# Patient Record
Sex: Male | Born: 1990 | Race: Black or African American | Hispanic: No | Marital: Single | Smoking: Never smoker
Health system: Southern US, Community
[De-identification: ages and names within clinical notes are randomized; demographics above are authoritative.]

## PROBLEM LIST (undated history)

## (undated) DIAGNOSIS — N492 Inflammatory disorders of scrotum: Secondary | ICD-10-CM

---

## 2014-12-20 ENCOUNTER — Other Ambulatory Visit (HOSPITAL_COMMUNITY)
Admission: RE | Admit: 2014-12-20 | Discharge: 2014-12-20 | Disposition: A | Discharge status: Home / Self Care | Payer: BLUE CROSS/BLUE SHIELD | Source: Ambulatory Visit | Attending: Family Medicine | Admitting: Family Medicine

## 2014-12-20 ENCOUNTER — Encounter (HOSPITAL_COMMUNITY): Payer: Self-pay | Admitting: Emergency Medicine

## 2014-12-20 ENCOUNTER — Emergency Department (INDEPENDENT_AMBULATORY_CARE_PROVIDER_SITE_OTHER)
Admission: EM | Admit: 2014-12-20 | Discharge: 2014-12-20 | Disposition: A | Discharge status: Home / Self Care | Payer: BLUE CROSS/BLUE SHIELD | Source: Home / Self Care

## 2014-12-20 DIAGNOSIS — Z113 Encounter for screening for infections with a predominantly sexual mode of transmission: Secondary | ICD-10-CM | POA: Diagnosis present

## 2014-12-20 DIAGNOSIS — Z202 Contact with and (suspected) exposure to infections with a predominantly sexual mode of transmission: Secondary | ICD-10-CM | POA: Diagnosis not present

## 2014-12-20 MED ORDER — AZITHROMYCIN 250 MG PO TABS
1000.0000 mg | ORAL_TABLET | Freq: Once | ORAL | Status: AC
  Administered 2014-12-20: 1000 mg via ORAL

## 2014-12-20 MED ORDER — AZITHROMYCIN 250 MG PO TABS
ORAL_TABLET | ORAL | Status: AC
  Filled 2014-12-20: qty 4
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
## 2014-12-20 NOTE — ED Notes (Signed)
Here to be screened for STD; he is asymptomatic Partner is being treated for chlamydia Alert, no signs of acute distress.

## 2014-12-20 NOTE — ED Notes (Signed)
Call back number verified.  

## 2014-12-20 NOTE — Discharge Instructions (Signed)
You are exposed to a STD. Your treated for this in our clinic with an antibiotic. Please go and eat some food as this medicine may make a little nauseous. We will call you if any of your other results show further infection. Remember to practice safe sex.

## 2014-12-21 LAB — URINE CYTOLOGY ANCILLARY ONLY
CHLAMYDIA, DNA PROBE: NEGATIVE
Neisseria Gonorrhea: NEGATIVE
Trichomonas: NEGATIVE

## 2016-03-21 ENCOUNTER — Encounter (HOSPITAL_COMMUNITY): Payer: Self-pay | Admitting: Emergency Medicine

## 2016-03-21 ENCOUNTER — Emergency Department (HOSPITAL_COMMUNITY): Payer: Self-pay

## 2016-03-21 ENCOUNTER — Emergency Department (HOSPITAL_COMMUNITY)
Admission: EM | Admit: 2016-03-21 | Discharge: 2016-03-21 | Disposition: A | Discharge status: Home / Self Care | Payer: Self-pay | Attending: Emergency Medicine | Admitting: Emergency Medicine

## 2016-03-21 DIAGNOSIS — Y999 Unspecified external cause status: Secondary | ICD-10-CM | POA: Insufficient documentation

## 2016-03-21 DIAGNOSIS — Y929 Unspecified place or not applicable: Secondary | ICD-10-CM | POA: Insufficient documentation

## 2016-03-21 DIAGNOSIS — Y9367 Activity, basketball: Secondary | ICD-10-CM | POA: Insufficient documentation

## 2016-03-21 DIAGNOSIS — M25571 Pain in right ankle and joints of right foot: Secondary | ICD-10-CM

## 2016-03-21 DIAGNOSIS — W1789XA Other fall from one level to another, initial encounter: Secondary | ICD-10-CM | POA: Insufficient documentation

## 2016-03-21 DIAGNOSIS — S92151A Displaced avulsion fracture (chip fracture) of right talus, initial encounter for closed fracture: Secondary | ICD-10-CM | POA: Insufficient documentation

## 2016-03-21 MED ORDER — NAPROXEN 250 MG PO TABS
500.0000 mg | ORAL_TABLET | Freq: Once | ORAL | Status: AC
  Administered 2016-03-21: 500 mg via ORAL
  Filled 2016-03-21: qty 2

## 2016-03-21 MED ORDER — NAPROXEN 500 MG PO TABS: 500.0000 mg | ORAL_TABLET | Freq: Two times a day (BID) | ORAL | 0 refills | Status: DC

## 2016-03-21 MED ORDER — HYDROCODONE-ACETAMINOPHEN 5-325 MG PO TABS
1.0000 | ORAL_TABLET | Freq: Once | ORAL | Status: AC
  Administered 2016-03-21: 1 via ORAL
  Filled 2016-03-21: qty 1

## 2016-03-21 MED ORDER — HYDROCODONE-ACETAMINOPHEN 5-325 MG PO TABS: 1.0000 | ORAL_TABLET | Freq: Four times a day (QID) | ORAL | 0 refills | Status: DC | PRN

## 2016-03-21 NOTE — ED Provider Notes (Signed)
[facility]EMERGENCY DEPT Provider Note   CSN: [id number] Arrival date & time: 03/21/16  1022  First Provider Contact:  First MD Initiated Contact with Patient 03/21/16 1048      By signing my name below, I, Tracy Cisneros, attest that this documentation has been prepared under the direction and in the presence of non-physician practitioner, Cheri Fowler, PA-C. Electronically Signed: Freida Cisneros, Scribe. 03/21/2016. 11:38 AM.   History   Chief Complaint Chief Complaint  Patient presents with  . Ankle Pain    The history is provided by the patient. No language interpreter was used.    HPI Comments:  Tracy Cisneros is a 25 y.o. male who presents to the Emergency Department complaining of constant, moderate, right ankle pain following injury last night. Pt states he landed improperly on the right foot after a jump. He denies numbness in the extremity. Pt has taken tylenol without relief. Pt has no other complaints or symptoms at this time.    History reviewed. No pertinent past medical history.  There are no active problems to display for this patient.   History reviewed. No pertinent surgical history.     Home Medications    Prior to Admission medications   Medication Sig Start Date End Date Taking? Authorizing Provider  HYDROcodone-acetaminophen (NORCO/VICODIN) 5-325 MG tablet Take 1-2 tablets by mouth every 6 (six) hours as needed. 03/21/16   Cheri Fowler, PA-C  naproxen (NAPROSYN) 500 MG tablet Take 1 tablet (500 mg total) by mouth 2 (two) times daily. 03/21/16   Cheri Fowler, PA-C    Family History Family History  Problem Relation Age of Onset  . Cancer Neg Hx   . Diabetes Neg Hx   . Heart failure Neg Hx   . Hyperlipidemia Neg Hx     Social History Social History  Substance Use Topics  . Smoking status: Never Smoker  . Smokeless tobacco: Never Used  . Alcohol use Yes     Allergies   Review of patient's allergies indicates no known allergies.   Review of  Systems Review of Systems  Musculoskeletal: Positive for arthralgias and myalgias.  Neurological: Negative for weakness and numbness.     Physical Exam Updated Vital Signs BP 126/96 (BP Location: Right Arm)   Pulse 78   Temp 97.6 F (36.4 C) (Oral)   Resp 18   Wt 113.4 kg   SpO2 99%   Physical Exam  Constitutional: He is oriented to person, place, and time. He appears well-developed and well-nourished.  HENT:  Head: Normocephalic and atraumatic.  Right Ear: External ear normal.  Left Ear: External ear normal.  Eyes: Conjunctivae are normal. No scleral icterus.  Neck: No tracheal deviation present.  Cardiovascular:  Pulses:      Dorsalis pedis pulses are 2+ on the right side, and 2+ on the left side.  Brisk cap refill.   Pulmonary/Chest: Effort normal. No respiratory distress.  Abdominal: He exhibits no distension.  Musculoskeletal: Normal range of motion. He exhibits edema and tenderness. He exhibits no deformity.  Right ankle TTP over lateral joint with mild swelling.  No erythema or ecchymosis.  FAROM with pain.  Neurological: He is alert and oriented to person, place, and time.  Strength and sensation intact.   Skin: Skin is warm and dry.  Psychiatric: He has a normal mood and affect. His behavior is normal.     ED Treatments / Results  DIAGNOSTIC STUDIES:  Oxygen Saturation is 99% on RA, normal by my interpretation.  COORDINATION OF CARE:  11:35 AM Discussed treatment plan with pt at bedside and pt agreed to plan.  Labs (all labs ordered are listed, but only abnormal results are displayed) Labs Reviewed - No data to display  EKG  EKG Interpretation None       Radiology Dg Ankle Complete Right  Result Date: 03/21/2016 CLINICAL DATA:  Basketball injury 1 day ago with persistent pain and swelling, initial encounter EXAM: RIGHT ANKLE - COMPLETE 3+ VIEW COMPARISON:  None. FINDINGS: Minimal soft tissue swelling is noted about the ankle. Some mild  irregularity of the lateral aspect of the talus is seen which may represent a small avulsion fracture. No other focal abnormality is noted. IMPRESSION: Question small avulsion from the lateral aspect of the talus. Electronically Signed   By: Alcide CleverMark  Lukens M.D.   On: 03/21/2016 11:11    Procedures Procedures (including critical care time)  Medications Ordered in ED Medications  HYDROcodone-acetaminophen (NORCO/VICODIN) 5-325 MG per tablet 1 tablet (1 tablet Oral Given 03/21/16 1148)  naproxen (NAPROSYN) tablet 500 mg (500 mg Oral Given 03/21/16 1148)     Initial Impression / Assessment and Plan / ED Course  I have reviewed the triage vital signs and the nursing notes.  Pertinent labs & imaging results that were available during my care of the patient were reviewed by me and considered in my medical decision making (see chart for details).  Clinical Course   Patient X-Ray shows questionable small avulsion from the lateral aspect of the talus. Pt advised to follow up with orthopedics. Patient given walking boot while in ED, conservative therapy recommended and discussed. Patient will be discharged home & is agreeable with above plan. Returns precautions discussed. Pt appears safe for discharge.   Final Clinical Impressions(s) / ED Diagnoses   Final diagnoses:  Avulsion fracture of right talus, closed, initial encounter  Right ankle pain    New Prescriptions New Prescriptions   HYDROCODONE-ACETAMINOPHEN (NORCO/VICODIN) 5-325 MG TABLET    Take 1-2 tablets by mouth every 6 (six) hours as needed.   NAPROXEN (NAPROSYN) 500 MG TABLET    Take 1 tablet (500 mg total) by mouth 2 (two) times daily.   I personally performed the services described in this documentation, which was scribed in my presence. The recorded information has been reviewed and is accurate.     Cheri FowlerKayla Dyanna Seiter, PA-C 03/21/16 1214    Shaune Pollackameron Isaacs, MD 03/23/16 [phone]

## 2016-03-21 NOTE — ED Notes (Signed)
Correct size Cam Walker not in stock. Ortho tech called.

## 2016-03-21 NOTE — ED Notes (Signed)
Returned from xray

## 2016-03-21 NOTE — ED Notes (Signed)
C/o right lateral ankle pain. No deformity noted.

## 2016-03-21 NOTE — Discharge Instructions (Signed)
Your xrays today showed a possible avulsion fracture from the lateral aspect of the talus.  We have placed you in a CAM walker and crutches.  Take Norco as needed for pain every 6 hours.  You may also take Naproxen.  Please follow up with Dr. Lajoyce Cornersuda in the next 2 days for repeat evaluation and further care.

## 2016-03-21 NOTE — Progress Notes (Signed)
Orthopedic Tech Progress Note Patient Details:  Tracy PerlaBrandon Cisneros [birth year] [id number]  Ortho Devices Type of Ortho Device: CAM walker, Crutches Ortho Device/Splint Location: rle Ortho Device/Splint Interventions: Application   Tracy Cisneros 03/21/2016, 12:08 PM

## 2016-03-21 NOTE — ED Triage Notes (Signed)
Pt. Stated, I was playing basketball and landed on my foot/ankle wrong.  I've tried to ice it, take Tylenol and nothing has help. Happened yesterday.

## 2016-03-21 NOTE — ED Notes (Signed)
PT. CURRENTLY IN X-RAY.  

## 2017-03-24 IMAGING — CR DG ANKLE COMPLETE 3+V*R*
3 series · 3 of 3 positions shown · non-contrast
Comparison: None.

CLINICAL DATA: Basketball injury 1 day ago with persistent pain and
swelling, initial encounter

EXAM:
RIGHT ANKLE - COMPLETE 3+ VIEW

[ankle obl]
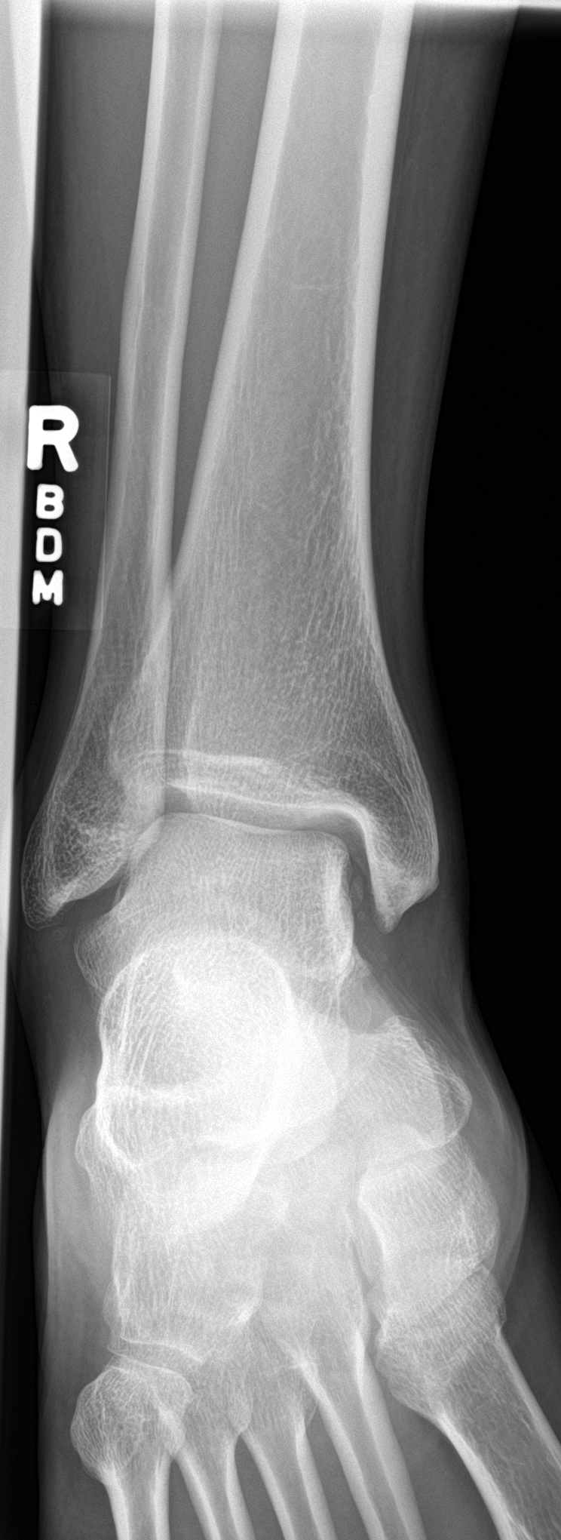

[ankle lat (1 of 2)]
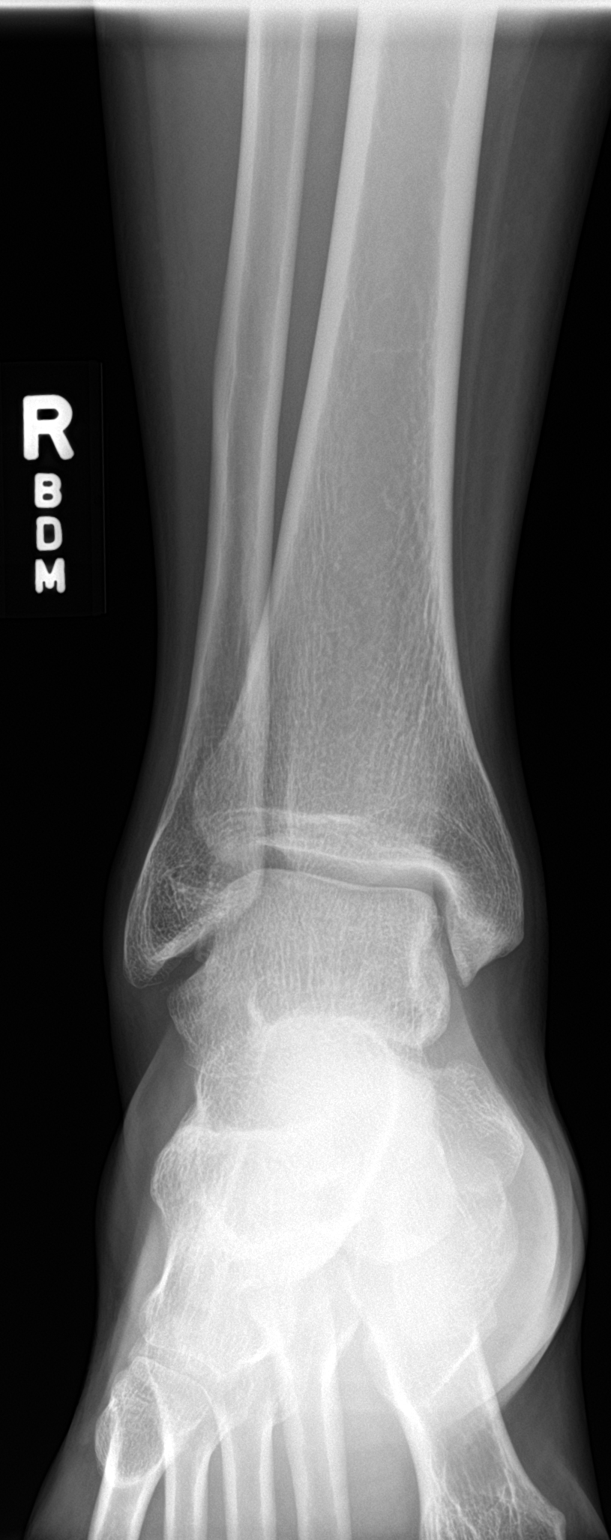

[ankle lat (2 of 2)]
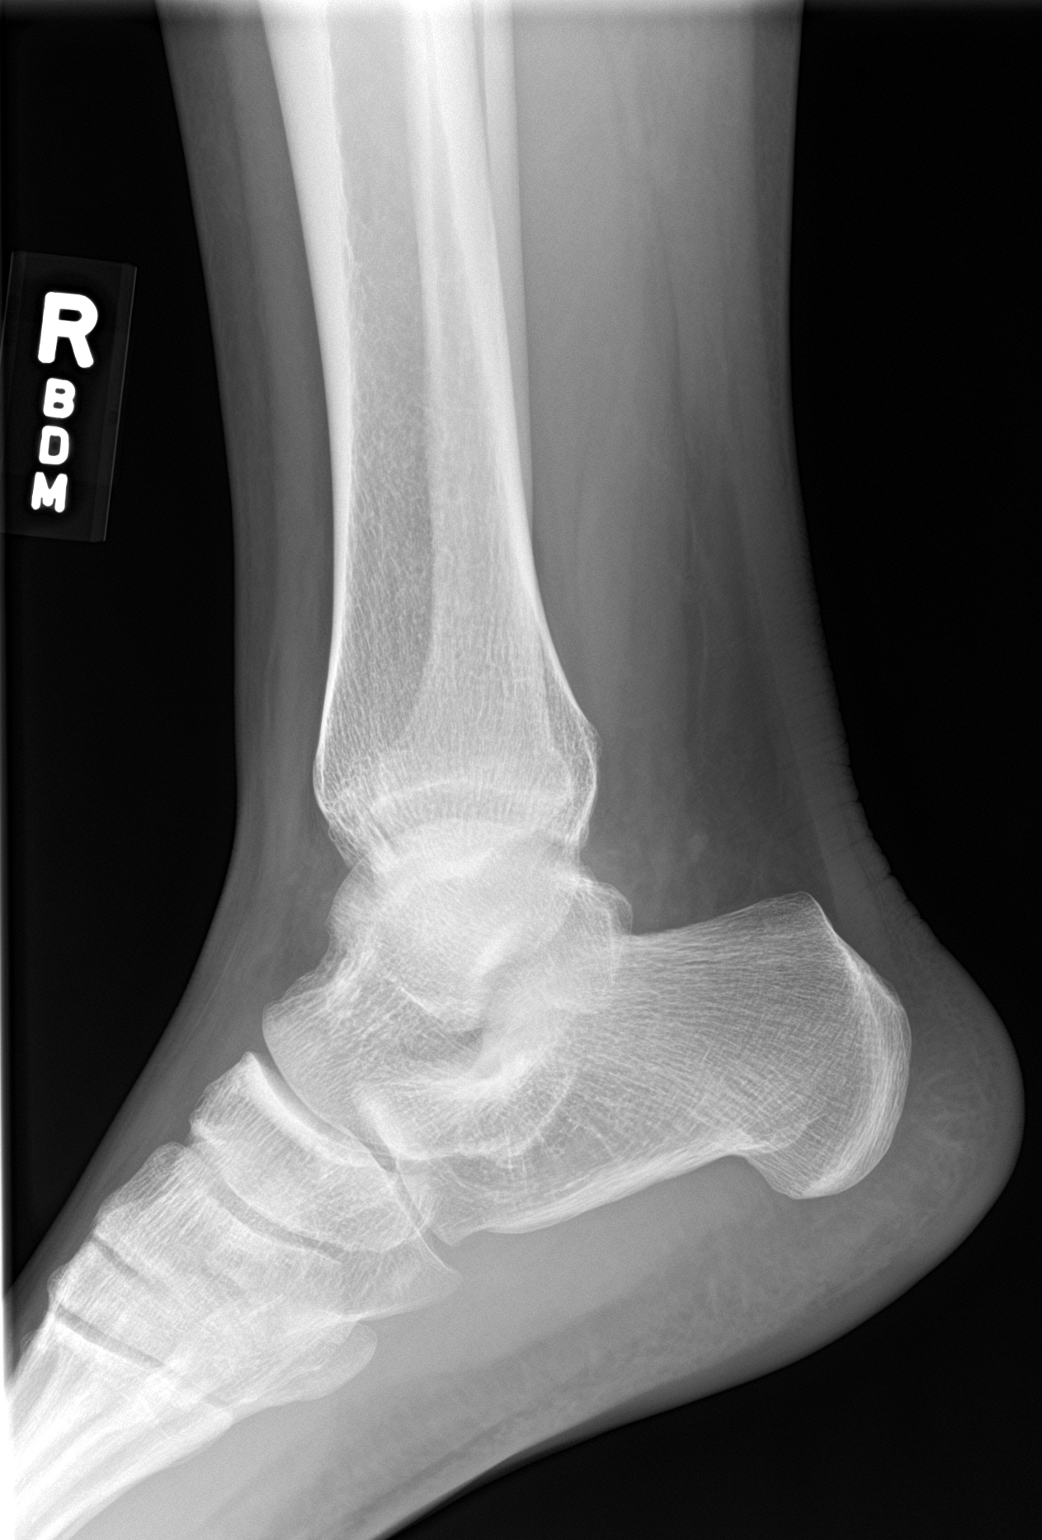

[3 of 3 positions shown; findings below may reference images not displayed]

FINDINGS: Minimal soft tissue swelling is noted about the ankle. Some mild
irregularity of the lateral aspect of the talus is seen which may
represent a small avulsion fracture. No other focal abnormality is
noted.
IMPRESSION: Question small avulsion from the lateral aspect of the talus.

## 2018-04-23 ENCOUNTER — Emergency Department (HOSPITAL_COMMUNITY)
Admission: EM | Admit: 2018-04-23 | Discharge: 2018-04-24 | Disposition: A | Discharge status: Home / Self Care | Payer: BLUE CROSS/BLUE SHIELD | Attending: Emergency Medicine | Admitting: Emergency Medicine

## 2018-04-23 ENCOUNTER — Encounter (HOSPITAL_COMMUNITY): Payer: Self-pay

## 2018-04-23 ENCOUNTER — Other Ambulatory Visit: Payer: Self-pay

## 2018-04-23 DIAGNOSIS — T7840XA Allergy, unspecified, initial encounter: Secondary | ICD-10-CM

## 2018-04-23 NOTE — ED Triage Notes (Addendum)
Pt reports that he was experiencing some sneezing and runny nose so he took a benadryl. He then started experiencing SOB, lip and tongue swelling, difficult and painful swallowing and hives on his hands and upper thighs. He also presents with a hoarse voice. A&Ox4. He states that he has had benadryl before without any problem.

## 2018-04-24 ENCOUNTER — Encounter (HOSPITAL_COMMUNITY): Payer: Self-pay | Admitting: Emergency Medicine

## 2018-04-24 MED ORDER — PREDNISONE 20 MG PO TABS: ORAL_TABLET | ORAL | 0 refills | Status: DC

## 2018-04-24 MED ORDER — METHYLPREDNISOLONE SODIUM SUCC 125 MG IJ SOLR
125.0000 mg | Freq: Once | INTRAMUSCULAR | Status: AC
  Administered 2018-04-24: 125 mg via INTRAVENOUS
  Filled 2018-04-24: qty 2

## 2018-04-24 MED ORDER — FAMOTIDINE 20 MG PO TABS: 20.0000 mg | ORAL_TABLET | Freq: Two times a day (BID) | ORAL | 0 refills | Status: DC

## 2018-04-24 MED ORDER — FAMOTIDINE IN NACL 20-0.9 MG/50ML-% IV SOLN
20.0000 mg | Freq: Once | INTRAVENOUS | Status: AC
  Administered 2018-04-24: 20 mg via INTRAVENOUS
  Filled 2018-04-24: qty 50

## 2018-04-24 NOTE — ED Notes (Signed)
Patient speaking in full sentences in no respiratory distress-palms of hands red and patient has stuffy nose

## 2018-04-24 NOTE — ED Provider Notes (Signed)
Tracy Cisneros Provider Note   CSN: [id number] Arrival date & time: 04/23/18  2342     History   Chief Complaint No chief complaint on file.   HPI Tracy Cisneros is a 27 y.o. male.  The history is provided by the patient.  Allergic Reaction  Presenting symptoms: itching   Presenting symptoms: no difficulty breathing, no difficulty swallowing, no rash and no wheezing   Presenting symptoms comment:  Watering puffy eyes Severity:  Moderate Prior allergic episodes:  Seasonal allergies Context: not food allergies, not grass, not insect bite/sting, not jewelry/metal, not new detergents/soaps, not nuts and not poison ivy   Relieved by:  Nothing Worsened by:  Nothing Ineffective treatments:  Antihistamines   History reviewed. No pertinent past medical history.  There are no active problems to display for this patient.   History reviewed. No pertinent surgical history.      Home Medications    Prior to Admission medications   Medication Sig Start Date End Date Taking? Authorizing Provider  HYDROcodone-acetaminophen (NORCO/VICODIN) 5-325 MG tablet Take 1-2 tablets by mouth every 6 (six) hours as needed. 03/21/16   Cheri Fowler, PA-C  naproxen (NAPROSYN) 500 MG tablet Take 1 tablet (500 mg total) by mouth 2 (two) times daily. 03/21/16   Cheri Fowler, PA-C    Family History Family History  Problem Relation Age of Onset  . Cancer Neg Hx   . Diabetes Neg Hx   . Heart failure Neg Hx   . Hyperlipidemia Neg Hx     Social History Social History   Tobacco Use  . Smoking status: Never Smoker  . Smokeless tobacco: Never Used  Substance Use Topics  . Alcohol use: Yes  . Drug use: No     Allergies   Patient has no known allergies.   Review of Systems Review of Systems  Constitutional: Negative for diaphoresis and fever.  HENT: Negative for facial swelling and trouble swallowing.   Eyes: Positive for redness. Negative for  photophobia, pain and visual disturbance.  Respiratory: Negative for cough, shortness of breath, wheezing and stridor.   Cardiovascular: Negative for chest pain, palpitations and leg swelling.  Skin: Positive for itching. Negative for rash.  All other systems reviewed and are negative.    Physical Exam Updated Vital Signs BP (!) 146/76 (BP Location: Right Arm)   Pulse 80   Temp 98.3 F (36.8 C) (Oral)   Resp 20   SpO2 99%   Physical Exam  Constitutional: He is oriented to person, place, and time. He appears well-developed and well-nourished. No distress.  HENT:  Head: Normocephalic and atraumatic.  Right Ear: External ear normal.  Left Ear: External ear normal.  Nose: Nose normal.  Mouth/Throat: Oropharynx is clear and moist. No oropharyngeal exudate.  No swelling of the lips tongue or uvula, intact phonation  Eyes: Pupils are equal, round, and reactive to light. Conjunctivae and EOM are normal.  Watering of eyes  Neck: Normal range of motion. Neck supple.  Cardiovascular: Normal rate, regular rhythm, normal heart sounds and intact distal pulses.  Pulmonary/Chest: Effort normal and breath sounds normal. No stridor. No respiratory distress. He has no wheezes. He has no rales.  Abdominal: Soft. Bowel sounds are normal. He exhibits no mass. There is no tenderness. There is no rebound and no guarding.  Musculoskeletal: Normal range of motion.  Neurological: He is alert and oriented to person, place, and time. He displays normal reflexes.  Skin: Skin is warm and dry. Capillary  refill takes less than 2 seconds. No rash noted.  Psychiatric: He has a normal mood and affect.  Nursing note and vitals reviewed.    ED Treatments / Results  Labs (all labs ordered are listed, but only abnormal results are displayed) Labs Reviewed - No data to display  EKG None  Radiology No results found.  Procedures Procedures (including critical care time)  Medications Ordered in  ED Medications  famotidine (PEPCID) IVPB 20 mg premix (20 mg Intravenous New Bag/Given 04/24/18 0021)  methylPREDNISolone sodium succinate (SOLU-MEDROL) 125 mg/2 mL injection 125 mg (125 mg Intravenous Given 04/24/18 0021)      Final Clinical Impressions(s) / ED Diagnoses   I suspect this is more season allergies.  Will start steroids.    Return for fevers >100.4 unrelieved by medication, shortness of breath, intractable vomiting, or diarrhea, Inability to tolerate liquids or food, cough, altered mental status or any concerns. No signs of systemic illness or infection. The patient is nontoxic-appearing on exam and vital signs are within normal limits.   I have reviewed the triage vital signs and the nursing notes. Pertinent labs &imaging results that were available during my care of the patient were reviewed by me and considered in my medical decision making (see chart for details).  After history, exam, and medical workup I feel the patient has been appropriately medically screened and is safe for discharge home. Pertinent diagnoses were discussed with the patient. Patient was given return precautions.   Tracy Speas, MD 04/24/18 [phone]

## 2022-05-19 ENCOUNTER — Emergency Department (HOSPITAL_BASED_OUTPATIENT_CLINIC_OR_DEPARTMENT_OTHER)
Admission: EM | Admit: 2022-05-19 | Discharge: 2022-05-19 | Disposition: A | Discharge status: Home / Self Care | Payer: BC Managed Care – PPO | Attending: Emergency Medicine | Admitting: Emergency Medicine

## 2022-05-19 ENCOUNTER — Emergency Department (HOSPITAL_BASED_OUTPATIENT_CLINIC_OR_DEPARTMENT_OTHER): Payer: BC Managed Care – PPO | Admitting: Radiology

## 2022-05-19 ENCOUNTER — Other Ambulatory Visit: Payer: Self-pay

## 2022-05-19 ENCOUNTER — Encounter (HOSPITAL_BASED_OUTPATIENT_CLINIC_OR_DEPARTMENT_OTHER): Payer: Self-pay | Admitting: Emergency Medicine

## 2022-05-19 DIAGNOSIS — Y92410 Unspecified street and highway as the place of occurrence of the external cause: Secondary | ICD-10-CM | POA: Insufficient documentation

## 2022-05-19 DIAGNOSIS — M545 Low back pain, unspecified: Secondary | ICD-10-CM | POA: Diagnosis not present

## 2022-05-19 DIAGNOSIS — M542 Cervicalgia: Secondary | ICD-10-CM | POA: Diagnosis present

## 2022-05-19 MED ORDER — ACETAMINOPHEN 500 MG PO TABS
1000.0000 mg | ORAL_TABLET | Freq: Once | ORAL | Status: AC
  Administered 2022-05-19: 1000 mg via ORAL
  Filled 2022-05-19: qty 2

## 2022-05-19 MED ORDER — KETOROLAC TROMETHAMINE 15 MG/ML IJ SOLN
15.0000 mg | Freq: Once | INTRAMUSCULAR | Status: AC
  Administered 2022-05-19: 15 mg via INTRAMUSCULAR
  Filled 2022-05-19: qty 1

## 2022-05-19 MED ORDER — LIDOCAINE 5 % EX PTCH
1.0000 | MEDICATED_PATCH | CUTANEOUS | Status: DC
  Administered 2022-05-19: 1 via TRANSDERMAL
  Filled 2022-05-19: qty 1
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
## 2022-05-19 NOTE — ED Notes (Signed)
RN provided AVS using Teachback Method. Patient verbalizes understanding of Discharge Instructions. Opportunity for Questioning and Answers were provided by RN. Patient Discharged from ED ambulatory to Home via Self.  
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
## 2023-05-04 ENCOUNTER — Emergency Department (HOSPITAL_COMMUNITY)
Admission: EM | Admit: 2023-05-04 | Discharge: 2023-05-05 | Discharge status: Against Medical Advice | Payer: BC Managed Care – PPO | Attending: Emergency Medicine | Admitting: Emergency Medicine

## 2023-05-04 ENCOUNTER — Encounter (HOSPITAL_COMMUNITY): Payer: Self-pay | Admitting: Emergency Medicine

## 2023-05-04 ENCOUNTER — Emergency Department (HOSPITAL_COMMUNITY): Payer: BC Managed Care – PPO

## 2023-05-04 DIAGNOSIS — N5089 Other specified disorders of the male genital organs: Secondary | ICD-10-CM | POA: Insufficient documentation

## 2023-05-04 DIAGNOSIS — Z79899 Other long term (current) drug therapy: Secondary | ICD-10-CM | POA: Diagnosis not present

## 2023-05-04 DIAGNOSIS — Z5321 Procedure and treatment not carried out due to patient leaving prior to being seen by health care provider: Secondary | ICD-10-CM | POA: Insufficient documentation

## 2023-05-04 DIAGNOSIS — N492 Inflammatory disorders of scrotum: Secondary | ICD-10-CM | POA: Insufficient documentation

## 2023-05-04 LAB — CBC WITH DIFFERENTIAL/PLATELET
Abs Immature Granulocytes: 0.02 10*3/uL (ref 0.00–0.07)
Basophils Absolute: 0.1 10*3/uL (ref 0.0–0.1)
Basophils Relative: 1 %
Eosinophils Absolute: 0.2 10*3/uL (ref 0.0–0.5)
Eosinophils Relative: 2 %
HCT: 42.8 % (ref 39.0–52.0)
Hemoglobin: 14.3 g/dL (ref 13.0–17.0)
Immature Granulocytes: 0 %
Lymphocytes Relative: 33 %
Lymphs Abs: 2.9 10*3/uL (ref 0.7–4.0)
MCH: 28.9 pg (ref 26.0–34.0)
MCHC: 33.4 g/dL (ref 30.0–36.0)
MCV: 86.5 fL (ref 80.0–100.0)
Monocytes Absolute: 0.6 10*3/uL (ref 0.1–1.0)
Monocytes Relative: 7 %
Neutro Abs: 4.9 10*3/uL (ref 1.7–7.7)
Neutrophils Relative %: 57 %
Platelets: 314 10*3/uL (ref 150–400)
RBC: 4.95 MIL/uL (ref 4.22–5.81)
RDW: 13.8 % (ref 11.5–15.5)
WBC: 8.7 10*3/uL (ref 4.0–10.5)
nRBC: 0 % (ref 0.0–0.2)

## 2023-05-04 LAB — URINALYSIS, ROUTINE W REFLEX MICROSCOPIC
Bacteria, UA: NONE SEEN
Bilirubin Urine: NEGATIVE
Glucose, UA: NEGATIVE mg/dL
Ketones, ur: 5 mg/dL — AB
Leukocytes,Ua: NEGATIVE
Nitrite: NEGATIVE
Protein, ur: NEGATIVE mg/dL
Specific Gravity, Urine: 1.024 (ref 1.005–1.030)
pH: 5 (ref 5.0–8.0)

## 2023-05-04 LAB — BASIC METABOLIC PANEL
Anion gap: 6 (ref 5–15)
BUN: 6 mg/dL (ref 6–20)
CO2: 26 mmol/L (ref 22–32)
Calcium: 8.6 mg/dL — ABNORMAL LOW (ref 8.9–10.3)
Chloride: 104 mmol/L (ref 98–111)
Creatinine, Ser: 1 mg/dL (ref 0.61–1.24)
GFR, Estimated: >60 mL/min (ref >60)
Glucose, Bld: 95 mg/dL (ref 70–99)
Potassium: 3.8 mmol/L (ref 3.5–5.1)
Sodium: 136 mmol/L (ref 135–145)

## 2023-05-04 NOTE — ED Triage Notes (Addendum)
Pt having swelling to base of testicular sack. Testicles moving freely in sack. Tender to touch and firm area to base on right side of testicular sack noted. Slightly reddened and having groin pain on right side. Started Saturday. No pain with urination and no discharge and denies unprotected sex. No issues with flow of urination or getting erection.

## 2023-05-05 ENCOUNTER — Other Ambulatory Visit (HOSPITAL_BASED_OUTPATIENT_CLINIC_OR_DEPARTMENT_OTHER): Payer: Self-pay

## 2023-05-05 ENCOUNTER — Emergency Department (HOSPITAL_BASED_OUTPATIENT_CLINIC_OR_DEPARTMENT_OTHER)
Admission: EM | Admit: 2023-05-05 | Discharge: 2023-05-05 | Disposition: A | Discharge status: Home / Self Care | Payer: BC Managed Care – PPO | Source: Home / Self Care

## 2023-05-05 ENCOUNTER — Other Ambulatory Visit: Payer: Self-pay

## 2023-05-05 ENCOUNTER — Encounter (HOSPITAL_BASED_OUTPATIENT_CLINIC_OR_DEPARTMENT_OTHER): Payer: Self-pay | Admitting: Emergency Medicine

## 2023-05-05 DIAGNOSIS — Z79899 Other long term (current) drug therapy: Secondary | ICD-10-CM | POA: Insufficient documentation

## 2023-05-05 DIAGNOSIS — N492 Inflammatory disorders of scrotum: Secondary | ICD-10-CM | POA: Insufficient documentation

## 2023-05-05 MED ORDER — DOXYCYCLINE HYCLATE 100 MG PO TABS
100.0000 mg | ORAL_TABLET | Freq: Once | ORAL | Status: AC
Start: 2023-05-05 — End: 2023-05-05
  Administered 2023-05-05: 100 mg via ORAL
  Filled 2023-05-05: qty 1

## 2023-05-05 MED ORDER — DOXYCYCLINE HYCLATE 100 MG PO CAPS
100.0000 mg | ORAL_CAPSULE | Freq: Two times a day (BID) | ORAL | 0 refills | Status: DC
  Filled 2023-05-05: qty 14, 7d supply, fill #0

## 2023-05-05 NOTE — ED Notes (Signed)
Pt left AMA

## 2023-05-05 NOTE — Discharge Instructions (Signed)
Please read and follow all provided instructions.  Your diagnoses today include:  1. Cellulitis of scrotum    Tests performed today include: Vital signs. See below for your results today.   Medications prescribed:  Doxycycline - antibiotic  You have been prescribed an antibiotic medicine: take the entire course of medicine even if you are feeling better. Stopping early can cause the antibiotic not to work.  Take any prescribed medications only as directed.   Home care instructions:  Follow any educational materials contained in this packet. Keep affected area above the level of your heart when possible. Wash area gently twice a day with warm soapy water. Do not apply alcohol or hydrogen peroxide. Cover the area if it draining or weeping.   Follow-up instructions: Return to the Emergency Department in 48 hours for a recheck if your symptoms are not significantly improved.   Return instructions:  Return to the Emergency Department if you have: Fever Worsening symptoms Worsening pain Worsening swelling Redness of the skin that moves away from the affected area, especially if it streaks away from the affected area  Any other emergent concerns  Your vital signs today were: BP 124/85 (BP Location: Left Arm)   Pulse 91   Temp 98.2 F (36.8 C) (Oral)   Resp 18   SpO2 99%  If your blood pressure (BP) was elevated above 135/85 this visit, please have this repeated by your doctor within one month. --------------

## 2023-05-05 NOTE — ED Triage Notes (Signed)
See at [facility] ysterday left before beening seen Blood work and US done last night ' Note from mc   "  Pt having swelling to base of testicular sack. Testicles moving freely in sack. Tender to touch and firm area to base on right side of testicular sack noted. Slightly reddened and having groin pain on right side. Started Saturday. No pain with urination and no discharge and denies unprotected sex. No issues with flow of urination or getting erection.

## 2023-05-05 NOTE — ED Provider Notes (Signed)
[place] EMERGENCY DEPARTMENT AT [facility] Provider Note   CSN: [id number] Arrival date & time: 05/05/23  1316     History  Chief Complaint  Patient presents with   Testicle Pain    Tracy Cisneros is a 32 y.o. male.  Patient presents to the emergency department for evaluation of scrotal pain.  Patient has a history of boils.  He has noted pain and swelling to the right scrotal area.  No drainage.  Patient presented to [facility] last night where he had an ultrasound and labs done, but left without getting results.  Symptoms persisted today prompting emergency department visit.  No fevers.  No history of diabetes.  He feels like the area might "break open" but has not yet.       Home Medications Prior to Admission medications   Medication Sig Start Date End Date Taking? Authorizing Provider  acetaminophen (TYLENOL) 325 MG tablet Take 325 mg by mouth every 6 (six) hours as needed for moderate pain.     [provider]  diphenhydrAMINE (BENADRYL) 25 MG tablet Take 25 mg by mouth every 6 (six) hours as needed for itching, allergies or sleep.    [provider]  famotidine (PEPCID) 20 MG tablet Take 1 tablet (20 mg total) by mouth 2 (two) times daily. 04/24/18   Palumbo, April, MD  HYDROcodone-acetaminophen (NORCO/VICODIN) 5-325 MG tablet Take 1-2 tablets by mouth every 6 (six) hours as needed. Patient not taking: Reported on 04/24/2018 03/21/16   Cheri Fowler, PA-C  loratadine (CLARITIN) 10 MG tablet Take 10 mg by mouth daily as needed for allergies.    [provider]  naproxen (NAPROSYN) 500 MG tablet Take 1 tablet (500 mg total) by mouth 2 (two) times daily. Patient not taking: Reported on 04/24/2018 03/21/16   Cheri Fowler, PA-C  predniSONE (DELTASONE) 20 MG tablet 3 tabs po day one, then 2 po daily x 4 days 04/24/18   Nicanor Alcon, April, MD      Allergies    Patient has no known allergies.    Review of Systems   Review of Systems  Physical  Exam Updated Vital Signs BP 124/85 (BP Location: Left Arm)   Pulse 91   Temp 98.2 F (36.8 C) (Oral)   Resp 18   SpO2 99%  Physical Exam Vitals and nursing note reviewed.  Constitutional:      Appearance: He is well-developed.  HENT:     Head: Normocephalic and atraumatic.  Eyes:     Conjunctiva/sclera: Conjunctivae normal.  Pulmonary:     Effort: No respiratory distress.  Genitourinary:   Musculoskeletal:     Cervical back: Normal range of motion and neck supple.  Skin:    General: Skin is warm and dry.  Neurological:     Mental Status: He is alert.     ED Results / Procedures / Treatments   Labs (all labs ordered are listed, but only abnormal results are displayed) Labs Reviewed - No data to display  EKG None  Radiology US Scrotum  Result Date: 05/04/2023 CLINICAL DATA:  Swelling EXAM: ULTRASOUND OF SCROTUM TECHNIQUE: Complete ultrasound examination of the testicles, epididymis, and other scrotal structures was performed. COMPARISON:  None Available. FINDINGS: Measurements: 5.2 x 2.9 x 4.6 cm. There are 2 echogenic foci within the right testicle measuring up to 3 mm. These may represent small calcifications. Testicular echotexture is otherwise homogeneous. Left testicle Measurements: 5.3 x 2.8 x 3.1 cm. There is a 3 mm echogenic focus in  the left testicle measuring 3 mm. This may represent a small calcification. Testicular echotexture is otherwise homogeneous. Right epididymis:  Normal in size and appearance. Left epididymis:  Normal in size and appearance. Hydrocele:  There small bilateral hydroceles. Varicocele:  None visualized. Other: There is mild scrotal wall thickening. Color Doppler flow is identified in both testicles. IMPRESSION: 1. No evidence of testicular torsion. 2. Small bilateral hydroceles. 3. Mild scrotal wall thickening. Electronically Signed   By: Darliss Cheney M.D.   On: 05/04/2023 23:42    Procedures Procedures    Medications Ordered in  ED Medications  doxycycline (VIBRA-TABS) tablet 100 mg (has no administration in time range)    ED Course/ Medical Decision Making/ A&P    Patient seen and examined. History obtained directly from patient.  Reviewed results of lab work and ultrasound performed yesterday.  CBC and BMP were unremarkable.  IMPRESSION: 1. No evidence of testicular torsion. 2. Small bilateral hydroceles. 3. Mild scrotal wall thickening.  Labs/EKG: None ordered  Imaging: None ordered  Medications/Fluids: Ordered: Doxycycline.   Most recent vital signs reviewed and are as follows: BP 124/85 (BP Location: Left Arm)   Pulse 91   Temp 98.2 F (36.8 C) (Oral)   Resp 18   SpO2 99%   Initial impression: Scrotal wall infection.  Limited bedside ultrasound was performed to evaluate for the presence of fluid or abscess.  Agree with mild scrotal wall thickening, no significant fluid collections noted on bedside exam.  Patient will be started on doxycycline.  Plan: Discharge to home.   Prescriptions written for: Doxycycline p.o.  Other home care instructions discussed: Warm soaks 2-3 times a day  ED return instructions discussed: The patient was urged to return to the Emergency Department urgently with worsening pain, swelling, expanding erythema especially if it streaks away from the affected area, fever, or if they have any other concerns.   Discussed with patient that he will need to monitor the area very closely.  It is possible that he may develop a more significant fluid collection and worsening swelling over the next couple of days which may warrant treatment.  Discussed the patient was urged to return to the Emergency Department or go to their PCP in 48 hours for wound recheck if the area is not significantly improved.  The patient verbalized understanding and stated agreement with this plan.                                 Medical Decision Making Risk Prescription drug management.  Patient  with localized area of scrotal wall swelling consistent with cellulitis.  Is possible that he has of developing abscess, however not seen on ultrasound last night and no fluid collections noted on bedside ultrasound here today.  Patient was started on doxycycline and we will have him perform usual wound care at home.  Discussed return instructions.  He is not a diabetic.  Appearance is not consistent with a necrotizing infection or Fournier's gangrene.         Final Clinical Impression(s) / ED Diagnoses Final diagnoses:  Cellulitis of scrotum    Rx / DC Orders ED Discharge Orders          Ordered    doxycycline (VIBRAMYCIN) 100 MG capsule  2 times daily        05/05/23 1459              Renne Crigler, PA-C  05/05/23 1508    Charlynne Pander, MD 05/05/23 [phone]

## 2023-08-04 ENCOUNTER — Emergency Department (HOSPITAL_COMMUNITY): Payer: BC Managed Care – PPO | Admitting: Anesthesiology

## 2023-08-04 ENCOUNTER — Ambulatory Visit (HOSPITAL_BASED_OUTPATIENT_CLINIC_OR_DEPARTMENT_OTHER)
Admission: EM | Admit: 2023-08-04 | Discharge: 2023-08-04 | Disposition: A | Discharge status: Home / Self Care | Payer: BC Managed Care – PPO | Attending: Emergency Medicine | Admitting: Emergency Medicine

## 2023-08-04 ENCOUNTER — Encounter (HOSPITAL_BASED_OUTPATIENT_CLINIC_OR_DEPARTMENT_OTHER): Payer: Self-pay

## 2023-08-04 ENCOUNTER — Emergency Department (HOSPITAL_COMMUNITY): Payer: BC Managed Care – PPO

## 2023-08-04 ENCOUNTER — Encounter (HOSPITAL_COMMUNITY)
Admission: EM | Disposition: A | Discharge status: Home / Self Care | Payer: Self-pay | Source: Home / Self Care | Attending: Emergency Medicine

## 2023-08-04 ENCOUNTER — Emergency Department (HOSPITAL_BASED_OUTPATIENT_CLINIC_OR_DEPARTMENT_OTHER): Payer: BC Managed Care – PPO

## 2023-08-04 ENCOUNTER — Other Ambulatory Visit: Payer: Self-pay

## 2023-08-04 DIAGNOSIS — F1290 Cannabis use, unspecified, uncomplicated: Secondary | ICD-10-CM | POA: Insufficient documentation

## 2023-08-04 DIAGNOSIS — N433 Hydrocele, unspecified: Secondary | ICD-10-CM | POA: Insufficient documentation

## 2023-08-04 DIAGNOSIS — N492 Inflammatory disorders of scrotum: Secondary | ICD-10-CM | POA: Diagnosis not present

## 2023-08-04 DIAGNOSIS — N5082 Scrotal pain: Secondary | ICD-10-CM

## 2023-08-04 DIAGNOSIS — N5089 Other specified disorders of the male genital organs: Secondary | ICD-10-CM | POA: Diagnosis present

## 2023-08-04 HISTORY — DX: Inflammatory disorders of scrotum: N49.2

## 2023-08-04 HISTORY — PX: IRRIGATION AND DEBRIDEMENT ABSCESS: SHX5252

## 2023-08-04 LAB — URINALYSIS, W/ REFLEX TO CULTURE (INFECTION SUSPECTED)
Bacteria, UA: NONE SEEN
Bilirubin Urine: NEGATIVE
Glucose, UA: NEGATIVE mg/dL
Ketones, ur: NEGATIVE mg/dL
Leukocytes,Ua: NEGATIVE
Nitrite: NEGATIVE
Protein, ur: NEGATIVE mg/dL
Specific Gravity, Urine: 1.015 (ref 1.005–1.030)
pH: 6 (ref 5.0–8.0)

## 2023-08-04 LAB — CBC WITH DIFFERENTIAL/PLATELET
Abs Immature Granulocytes: 0.02 10*3/uL (ref 0.00–0.07)
Basophils Absolute: 0 10*3/uL (ref 0.0–0.1)
Basophils Relative: 0 %
Eosinophils Absolute: 0.1 10*3/uL (ref 0.0–0.5)
Eosinophils Relative: 1 %
HCT: 40.4 % (ref 39.0–52.0)
Hemoglobin: 13.5 g/dL (ref 13.0–17.0)
Immature Granulocytes: 0 %
Lymphocytes Relative: 25 %
Lymphs Abs: 2.3 10*3/uL (ref 0.7–4.0)
MCH: 28.2 pg (ref 26.0–34.0)
MCHC: 33.4 g/dL (ref 30.0–36.0)
MCV: 84.3 fL (ref 80.0–100.0)
Monocytes Absolute: 0.7 10*3/uL (ref 0.1–1.0)
Monocytes Relative: 7 %
Neutro Abs: 6.2 10*3/uL (ref 1.7–7.7)
Neutrophils Relative %: 67 %
Platelets: 317 10*3/uL (ref 150–400)
RBC: 4.79 MIL/uL (ref 4.22–5.81)
RDW: 13.7 % (ref 11.5–15.5)
WBC: 9.4 10*3/uL (ref 4.0–10.5)
nRBC: 0 % (ref 0.0–0.2)

## 2023-08-04 LAB — BASIC METABOLIC PANEL
Anion gap: 7 (ref 5–15)
BUN: 8 mg/dL (ref 6–20)
CO2: 30 mmol/L (ref 22–32)
Calcium: 8.7 mg/dL — ABNORMAL LOW (ref 8.9–10.3)
Chloride: 101 mmol/L (ref 98–111)
Creatinine, Ser: 1.02 mg/dL (ref 0.61–1.24)
GFR, Estimated: >60 mL/min (ref >60)
Glucose, Bld: 103 mg/dL — ABNORMAL HIGH (ref 70–99)
Potassium: 3.6 mmol/L (ref 3.5–5.1)
Sodium: 138 mmol/L (ref 135–145)

## 2023-08-04 LAB — LACTIC ACID, PLASMA: Lactic Acid, Venous: 0.5 mmol/L (ref 0.5–1.9)

## 2023-08-04 SURGERY — IRRIGATION AND DEBRIDEMENT ABSCESS
Anesthesia: General

## 2023-08-04 MED ORDER — LACTATED RINGERS IV SOLN: INTRAVENOUS | Status: DC | PRN

## 2023-08-04 MED ORDER — KETAMINE HCL 10 MG/ML IJ SOLN
INTRAMUSCULAR | Status: DC | PRN
  Administered 2023-08-04: 30 mg via INTRAVENOUS

## 2023-08-04 MED ORDER — IOHEXOL 300 MG/ML  SOLN
100.0000 mL | Freq: Once | INTRAMUSCULAR | Status: AC | PRN
  Administered 2023-08-04: 100 mL via INTRAVENOUS

## 2023-08-04 MED ORDER — CLINDAMYCIN PHOSPHATE 600 MG/50ML IV SOLN
600.0000 mg | Freq: Once | INTRAVENOUS | Status: AC
Start: 2023-08-04 — End: 2023-08-04
  Administered 2023-08-04: 600 mg via INTRAVENOUS
  Filled 2023-08-04: qty 50

## 2023-08-04 MED ORDER — VANCOMYCIN HCL IN DEXTROSE 1-5 GM/200ML-% IV SOLN
1000.0000 mg | Freq: Once | INTRAVENOUS | Status: AC
Start: 2023-08-04 — End: 2023-08-04
  Administered 2023-08-04: 1000 mg via INTRAVENOUS
  Filled 2023-08-04: qty 200

## 2023-08-04 MED ORDER — LIDOCAINE HCL (PF) 2 % IJ SOLN
INTRAMUSCULAR | Status: DC | PRN
  Administered 2023-08-04: 60 mg via INTRADERMAL

## 2023-08-04 MED ORDER — ONDANSETRON HCL 4 MG/2ML IJ SOLN
INTRAMUSCULAR | Status: DC | PRN
  Administered 2023-08-04: 4 mg via INTRAVENOUS

## 2023-08-04 MED ORDER — FENTANYL CITRATE PF 50 MCG/ML IJ SOSY
50.0000 ug | PREFILLED_SYRINGE | Freq: Once | INTRAMUSCULAR | Status: AC
  Administered 2023-08-04: 50 ug via INTRAVENOUS
  Filled 2023-08-04: qty 1

## 2023-08-04 MED ORDER — PROPOFOL 10 MG/ML IV BOLUS
INTRAVENOUS | Status: AC
  Filled 2023-08-04: qty 20

## 2023-08-04 MED ORDER — OXYCODONE-ACETAMINOPHEN 5-325 MG PO TABS
1.0000 | ORAL_TABLET | ORAL | Status: DC | PRN
  Administered 2023-08-04: 1 via ORAL
  Filled 2023-08-04: qty 1

## 2023-08-04 MED ORDER — LACTATED RINGERS IV BOLUS
1000.0000 mL | Freq: Once | INTRAVENOUS | Status: AC
  Administered 2023-08-04: 1000 mL via INTRAVENOUS

## 2023-08-04 MED ORDER — DOXYCYCLINE HYCLATE 50 MG PO CAPS: 100.0000 mg | ORAL_CAPSULE | Freq: Two times a day (BID) | ORAL | 0 refills | Status: AC

## 2023-08-04 MED ORDER — PIPERACILLIN-TAZOBACTAM 3.375 G IVPB 30 MIN
3.3750 g | Freq: Once | INTRAVENOUS | Status: AC
  Administered 2023-08-04: 3.375 g via INTRAVENOUS
  Filled 2023-08-04: qty 50

## 2023-08-04 MED ORDER — OXYCODONE HCL 5 MG PO TABS: 5.0000 mg | ORAL_TABLET | Freq: Once | ORAL | Status: DC | PRN

## 2023-08-04 MED ORDER — MIDAZOLAM HCL 5 MG/5ML IJ SOLN
INTRAMUSCULAR | Status: DC | PRN
  Administered 2023-08-04: 2 mg via INTRAVENOUS

## 2023-08-04 MED ORDER — FENTANYL CITRATE PF 50 MCG/ML IJ SOSY: 25.0000 ug | PREFILLED_SYRINGE | INTRAMUSCULAR | Status: DC | PRN

## 2023-08-04 MED ORDER — TRAMADOL HCL 50 MG PO TABS
50.0000 mg | ORAL_TABLET | Freq: Four times a day (QID) | ORAL | 0 refills | Status: AC | PRN
Start: 2023-08-04 — End: ?

## 2023-08-04 MED ORDER — ONDANSETRON HCL 4 MG/2ML IJ SOLN: 4.0000 mg | Freq: Four times a day (QID) | INTRAMUSCULAR | Status: DC | PRN

## 2023-08-04 MED ORDER — KETAMINE HCL 50 MG/5ML IJ SOSY
PREFILLED_SYRINGE | INTRAMUSCULAR | Status: AC
  Filled 2023-08-04: qty 5

## 2023-08-04 MED ORDER — DEXAMETHASONE SODIUM PHOSPHATE 10 MG/ML IJ SOLN
INTRAMUSCULAR | Status: DC | PRN
  Administered 2023-08-04: 10 mg via INTRAVENOUS

## 2023-08-04 MED ORDER — DEXMEDETOMIDINE HCL IN NACL 200 MCG/50ML IV SOLN
INTRAVENOUS | Status: DC | PRN
  Administered 2023-08-04: 8 ug via INTRAVENOUS
  Administered 2023-08-04: 12 ug via INTRAVENOUS

## 2023-08-04 MED ORDER — FENTANYL CITRATE (PF) 100 MCG/2ML IJ SOLN
INTRAMUSCULAR | Status: AC
  Filled 2023-08-04: qty 2

## 2023-08-04 MED ORDER — OXYCODONE HCL 5 MG/5ML PO SOLN: 5.0000 mg | Freq: Once | ORAL | Status: DC | PRN

## 2023-08-04 MED ORDER — FENTANYL CITRATE (PF) 100 MCG/2ML IJ SOLN
INTRAMUSCULAR | Status: DC | PRN
  Administered 2023-08-04 (×2): 50 ug via INTRAVENOUS

## 2023-08-04 MED ORDER — MIDAZOLAM HCL 2 MG/2ML IJ SOLN
INTRAMUSCULAR | Status: AC
  Filled 2023-08-04: qty 2

## 2023-08-04 MED ORDER — PROPOFOL 10 MG/ML IV BOLUS
INTRAVENOUS | Status: DC | PRN
  Administered 2023-08-04: 200 mg via INTRAVENOUS

## 2023-08-04 SURGICAL SUPPLY — 39 items
BAG COUNTER SPONGE SURGICOUNT (BAG) IMPLANT
BENZOIN TINCTURE PRP APPL 2/3 (GAUZE/BANDAGES/DRESSINGS) IMPLANT
BLADE HEX COATED 2.75 (ELECTRODE) ×2 IMPLANT
BLADE SURG 15 STRL LF DISP TIS (BLADE) ×2 IMPLANT
BLADE SURG SZ10 CARB STEEL (BLADE) ×2 IMPLANT
COVER SURGICAL LIGHT HANDLE (MISCELLANEOUS) ×1 IMPLANT
DRAIN PENROSE 0.25X18 (DRAIN) IMPLANT
DRAPE LAPAROSCOPIC ABDOMINAL (DRAPES) IMPLANT
DRAPE LAPAROTOMY T 102X78X121 (DRAPES) IMPLANT
DRAPE LAPAROTOMY TRNSV 102X78 (DRAPES) IMPLANT
DRAPE POUCH INSTRU U-SHP 10X18 (DRAPES) ×1 IMPLANT
DRAPE SHEET LG 3/4 BI-LAMINATE (DRAPES) IMPLANT
ELECT REM PT RETURN 15FT ADLT (MISCELLANEOUS) ×2 IMPLANT
EVACUATOR SILICONE 100CC (DRAIN) IMPLANT
GAUZE SPONGE 4X4 12PLY STRL (GAUZE/BANDAGES/DRESSINGS) ×2 IMPLANT
GLOVE BIOGEL PI IND STRL 7.0 (GLOVE) ×1 IMPLANT
GOWN STRL REUS W/ TWL XL LVL3 (GOWN DISPOSABLE) ×3 IMPLANT
KIT BASIN OR (CUSTOM PROCEDURE TRAY) ×2 IMPLANT
KIT TURNOVER KIT A (KITS) IMPLANT
LEGGING LITHOTOMY PAIR STRL (DRAPES) IMPLANT
NDL HYPO 25X1 1.5 SAFETY (NEEDLE) ×2 IMPLANT
NEEDLE HYPO 25X1 1.5 SAFETY (NEEDLE) IMPLANT
NS IRRIG 1000ML POUR BTL (IV SOLUTION) ×2 IMPLANT
PACK BASIC VI WITH GOWN DISP (CUSTOM PROCEDURE TRAY) ×2 IMPLANT
PACK GENERAL/GYN (CUSTOM PROCEDURE TRAY) IMPLANT
PENCIL SMOKE EVACUATOR (MISCELLANEOUS) IMPLANT
SOL PREP POV-IOD 4OZ 10% (MISCELLANEOUS) ×1 IMPLANT
SPIKE FLUID TRANSFER (MISCELLANEOUS) IMPLANT
SPONGE T-LAP 4X18 ~~LOC~~+RFID (SPONGE) ×2 IMPLANT
STAPLER SKIN PROX WIDE 3.9 (STAPLE) IMPLANT
STRIP CLOSURE SKIN 1/2X4 (GAUZE/BANDAGES/DRESSINGS) IMPLANT
SUT CHROMIC 3 0 SH 27 (SUTURE) IMPLANT
SUT ETHILON 3 0 PS 1 (SUTURE) IMPLANT
SUT MNCRL AB 4-0 PS2 18 (SUTURE) IMPLANT
SUT VIC AB 3-0 SH 18 (SUTURE) IMPLANT
SWAB CULTURE ESWAB REG 1ML (MISCELLANEOUS) IMPLANT
SYR CONTROL 10ML LL (SYRINGE) ×2 IMPLANT
TOWEL OR 17X26 10 PK STRL BLUE (TOWEL DISPOSABLE) ×1 IMPLANT
WATER STERILE IRR 1000ML POUR (IV SOLUTION) ×1 IMPLANT

## 2023-08-04 NOTE — Op Note (Signed)
Preoperative diagnosis: Scrotal abscess  Postoperative diagnosis: Scrotal abscess  Procedure: Incision and drainage of scrotal abscess (4 cm)  Surgeon: Moody Bruins MD  Anesthesia: General  Complications: None  Specimens: Aerobic and anaerobic cultures  Disposition of specimens: Microbiology lab  Drains: Corner inch Penrose drain  Indication: Mr. Yokel is a 32 year old gentleman who developed a scrotal abscess.  He presented to the emergency department.  This did appear to be a localized abscess and he was recommended to undergo operative intervention for incision and drainage.  The potential risks, complications, and expected recovery process were discussed in detail.  Informed consent was obtained.  Description of procedure: The patient was taken the operating room and a general anesthetic was administered.  He had been started on broad-spectrum antibiotics in the emergency department.  He was placed in the dorsolithotomy position and prepped and draped in usual sterile fashion.  Preoperative timeout was performed.  An incision was made in the fluctuant dependent portion of the scrotal abscess.  A large amount of pus under high pressure was expelled.  Aerobic and anaerobic cultures were obtained.  Once it was ensured that all pus was drained.  A separate stab incision was made and the 0.25% Penrose was placed into the abscess cavity.  This was secured to the skin with a nylon suture.  The abscess cavity was then washed out with saline.  The original opening was then closed loosely with interrupted 3-0 chromic sutures.  The patient tolerated the procedure well without complications.  He was able to be awakened and transferred to recovery in satisfactory condition.

## 2023-08-04 NOTE — Transfer of Care (Signed)
Immediate Anesthesia Transfer of Care Note  Patient: Tracy Cisneros  Procedure(s) Performed: IRRIGATION AND DEBRIDEMENT ABSCESS  Patient Location: PACU  Anesthesia Type:General  Level of Consciousness: drowsy  Airway & Oxygen Therapy: Patient Spontanous Breathing and Patient connected to face mask  Post-op Assessment: Report given to RN  Post vital signs: Reviewed and stable  Last Vitals:  Vitals Value Taken Time  BP 135/93 08/04/23 1316  Temp    Pulse 89 08/04/23 1327  Resp 23 08/04/23 1327  SpO2 97 % 08/04/23 1327  Vitals shown include unfiled device data.  Last Pain:  Vitals:   08/04/23 1101  TempSrc:   PainSc: 5          Complications: No notable events documented.

## 2023-08-04 NOTE — Anesthesia Procedure Notes (Signed)
Procedure Name: LMA Insertion Date/Time: 08/04/2023 12:38 PM  Performed by: Randa Evens, CRNAPre-anesthesia Checklist: Patient identified, Emergency Drugs available, Suction available and Patient being monitored Patient Re-evaluated:Patient Re-evaluated prior to induction Oxygen Delivery Method: Circle System Utilized Preoxygenation: Pre-oxygenation with 100% oxygen Induction Type: IV induction Ventilation: Mask ventilation without difficulty LMA: LMA inserted LMA Size: 5.0 Number of attempts: 1 Airway Equipment and Method: Bite block Placement Confirmation: positive ETCO2 Tube secured with: Tape Dental Injury: Teeth and Oropharynx as per pre-operative assessment

## 2023-08-04 NOTE — ED Provider Notes (Addendum)
EMERGENCY DEPARTMENT AT [facility] Provider Note   CSN: [id number] Arrival date & time: 08/04/23  0542     History  Chief Complaint  Patient presents with   Groin Swelling    Tracy Cisneros is a 32 y.o. male.  HPI   32 year old male with medical history significant for scrotal cellulitis and abscess which spontaneously ruptured and drained on its own in September 2024 who presents to the emergency department with scrotal pain and swelling.  The patient states that he has had rapid progression of pain and swelling in the right side of his scrotum.  He states this is similar to the last time.  It is tracking up his right groin as well.  No fevers or chills.  He does state that after he was seen in the ER and prescribed doxycycline he had an area of swelling that subsequently ruptured and drained pus.  Home Medications Prior to Admission medications   Medication Sig Start Date End Date Taking? Authorizing Provider  acetaminophen (TYLENOL) 325 MG tablet Take 325 mg by mouth every 6 (six) hours as needed for moderate pain.     [provider]  diphenhydrAMINE (BENADRYL) 25 MG tablet Take 25 mg by mouth every 6 (six) hours as needed for itching, allergies or sleep.    [provider]  doxycycline (VIBRAMYCIN) 100 MG capsule Take 1 capsule (100 mg total) by mouth 2 (two) times daily. 05/05/23   Renne Crigler, PA-C  famotidine (PEPCID) 20 MG tablet Take 1 tablet (20 mg total) by mouth 2 (two) times daily. 04/24/18   Palumbo, April, MD  HYDROcodone-acetaminophen (NORCO/VICODIN) 5-325 MG tablet Take 1-2 tablets by mouth every 6 (six) hours as needed. Patient not taking: Reported on 04/24/2018 03/21/16   Cheri Fowler, PA-C  loratadine (CLARITIN) 10 MG tablet Take 10 mg by mouth daily as needed for allergies.    [provider]  naproxen (NAPROSYN) 500 MG tablet Take 1 tablet (500 mg total) by mouth 2 (two) times daily. Patient not taking: Reported  on 04/24/2018 03/21/16   Cheri Fowler, PA-C  predniSONE (DELTASONE) 20 MG tablet 3 tabs po day one, then 2 po daily x 4 days 04/24/18   Nicanor Alcon, April, MD      Allergies    Patient has no known allergies.    Review of Systems   Review of Systems  Genitourinary:  Positive for scrotal swelling and testicular pain.  All other systems reviewed and are negative.   Physical Exam Updated Vital Signs BP 124/79 (BP Location: Right Arm)   Pulse 94   Temp 98.2 F (36.8 C)   Resp 16   Ht 6\' 1"  (1.854 m)   Wt 117.9 kg   SpO2 95%   BMI 34.30 kg/m  Physical Exam Vitals and nursing note reviewed. Exam conducted with a chaperone present.  Constitutional:      General: He is not in acute distress. HENT:     Head: Normocephalic and atraumatic.  Eyes:     Conjunctiva/sclera: Conjunctivae normal.     Pupils: Pupils are equal, round, and reactive to light.  Cardiovascular:     Rate and Rhythm: Normal rate and regular rhythm.  Pulmonary:     Effort: Pulmonary effort is normal. No respiratory distress.  Abdominal:     General: There is no distension.     Tenderness: There is no abdominal tenderness. There is no guarding.  Genitourinary:    Epididymis:     Right: Tenderness present.  Comments: Right inguinal tenderness, scrotal thickening on the right with an area of fluctuance. No palpable crepitus.  On arrival, the Musculoskeletal:        General: No deformity or signs of injury.     Cervical back: Neck supple.  Skin:    Findings: No lesion or rash.  Neurological:     General: No focal deficit present.     Mental Status: He is alert. Mental status is at baseline.      ED Results / Procedures / Treatments   Labs (all labs ordered are listed, but only abnormal results are displayed) Labs Reviewed  CULTURE, BLOOD (ROUTINE X 2)  CULTURE, BLOOD (ROUTINE X 2)  CBC WITH DIFFERENTIAL/PLATELET  BASIC METABOLIC PANEL  URINALYSIS, W/ REFLEX TO CULTURE (INFECTION SUSPECTED)  LACTIC  ACID, PLASMA  LACTIC ACID, PLASMA    EKG None  Radiology No results found.  Procedures Procedures    Medications Ordered in ED Medications  vancomycin (VANCOCIN) IVPB 1000 mg/200 mL premix (has no administration in time range)  piperacillin-tazobactam (ZOSYN) IVPB 3.375 g (has no administration in time range)  clindamycin (CLEOCIN) IVPB 600 mg (600 mg Intravenous New Bag/Given 08/04/23 0824)  lactated ringers bolus 1,000 mL (has no administration in time range)  fentaNYL (SUBLIMAZE) injection 50 mcg (50 mcg Intravenous Given 08/04/23 0825)    ED Course/ Medical Decision Making/ A&P                                 Medical Decision Making Amount and/or Complexity of Data Reviewed Labs: ordered. Radiology: ordered.  Risk Prescription drug management.   32 year old male with medical history significant for scrotal cellulitis and abscess which spontaneously ruptured and drained on its own in September 2024 who presents to the emergency department with scrotal pain and swelling.  The patient states that he has had rapid progression of pain and swelling in the right side of his scrotum.  He states this is similar to the last time.  It is tracking up his right groin as well.  No fevers or chills.  He does state that after he was seen in the ER and prescribed doxycycline he had an area of swelling that subsequently ruptured and drained pus.  On arrival, the patient was afebrile, heart rate 94, BP 124/79, saturating 95% on room air.  Sinus rhythm noted on cardiac telemetry.  Physical exam revealed right inguinal tenderness, scrotal thickening on the right with an area of fluctuance consistent with likely scrotal cellulitis and developing scrotal abscess.  No palpable crepitus however patient has had rapid progression over the past 2 days.  Necrotizing fasciitis considered less likely but remains on the differential.  Will obtain IV access and laboratory workup in addition to CT imaging  and cover with broad-spectrum antibiotics.  Urology was consulted.  Labs: Pending  CT Pelvis W: Read pending  Urology Consult: Spoke with Dr Laverle Patter, on-call Urology who recommended ER to ER transfer to [facility] for further evaluation.  We do not have ultrasound available until 9 AM, urology agreed with plan for CT imaging and labs.   Dr. [name] accepted the patient in ER to ER transfer. Pt updated regarding plan of care. [facility] contacted for transfer.   Final Clinical Impression(s) / ED Diagnoses Final diagnoses:  Scrotal swelling  Scrotal pain    Rx / DC Orders ED Discharge Orders     None  Ernie Avena, MD 08/04/23 4403    Ernie Avena, MD 08/04/23 [phone]

## 2023-08-04 NOTE — ED Triage Notes (Signed)
Pt coming in for right scrotum swelling and pain beginning yesterday. Pt reports same problem happening in September when he came here. Diagnosed with cellulitis at the time and started on ATB, patient states the scotum 'popped' on it's own at that time and drained. Pt denies fevers.

## 2023-08-04 NOTE — Anesthesia Preprocedure Evaluation (Signed)
Anesthesia Evaluation  Patient identified by MRN, date of birth, ID band Patient awake    Reviewed: Allergy & Precautions, H&P , NPO status , Patient's Chart, lab work & pertinent test results  Airway Mallampati: II   Neck ROM: full    Dental   Pulmonary neg pulmonary ROS, Patient abstained from smoking.   breath sounds clear to auscultation       Cardiovascular negative cardio ROS  Rhythm:regular Rate:Normal     Neuro/Psych    GI/Hepatic   Endo/Other    Renal/GU      Musculoskeletal   Abdominal   Peds  Hematology   Anesthesia Other Findings   Reproductive/Obstetrics                             Anesthesia Physical Anesthesia Plan  ASA: 1  Anesthesia Plan: General   Post-op Pain Management:    Induction: Intravenous  PONV Risk Score and Plan: 2 and Ondansetron, Dexamethasone, Midazolam and Treatment may vary due to age or medical condition  Airway Management Planned: LMA  Additional Equipment:   Intra-op Plan:   Post-operative Plan: Extubation in OR  Informed Consent: I have reviewed the patients History and Physical, chart, labs and discussed the procedure including the risks, benefits and alternatives for the proposed anesthesia with the patient or authorized representative who has indicated his/her understanding and acceptance.     Dental advisory given  Plan Discussed with: CRNA, Anesthesiologist and Surgeon  Anesthesia Plan Comments:        Anesthesia Quick Evaluation

## 2023-08-04 NOTE — Consult Note (Signed)
Urology Consult   Physician requesting consult: Dr. Durwin Nora  Reason for consult: Scrotal abscess  History of Present Illness: Tracy Cisneros is a 32 y.o. with a history of a scrotal abscess in September treated with doxycycline and eventually spontaneous drained and resolved.  He developed recurrent pain in the right hemiscrotum approximately 2 days ago associated with fever.  He presented to the emergency department with worsening pain today.  There was an inability to get an ultrasound at the outside emergency room and he had a CT scan of the abdomen and pelvis which was fairly unremarkable except for a localized fluid collection in the right hemiscrotum consistent with a probable abscess.  He denies a history of voiding or storage urinary symptoms, hematuria, UTIs, STDs, urolithiasis, GU malignancy/trauma/surgery.  Past Medical History:  Diagnosis Date   Cellulitis of scrotum     History reviewed. No pertinent surgical history.  Medications:  Home meds:  No prescription medications.  He does smoke marijuana regularly.    Scheduled Meds: Continuous Infusions: PRN Meds:.  Allergies: No Known Allergies  Family History  Problem Relation Age of Onset   Cancer Neg Hx    Diabetes Neg Hx    Heart failure Neg Hx    Hyperlipidemia Neg Hx     Social History:  reports that he has never smoked. He has never used smokeless tobacco. He reports current alcohol use. He reports that he does not use drugs.  ROS: A complete review of systems was performed.  All systems are negative except for pertinent findings as noted.  Physical Exam:  Vital signs in last 24 hours: Temp:  [98.2 F (36.8 C)-98.5 F (36.9 C)] 98.5 F (36.9 C) (12/24 1007) Pulse Rate:  [78-94] 78 (12/24 1007) Resp:  [15-16] 16 (12/24 1007) BP: (101-124)/(79-86) 101/86 (12/24 1007) SpO2:  [95 %-99 %] 99 % (12/24 1007) Weight:  [117.9 kg] 117.9 kg (12/24 0552) Constitutional:  Alert and oriented, No acute  distress Cardiovascular: Regular rate and rhythm, No JVD Respiratory: Normal respiratory effort GI: Abdomen is soft, nontender, nondistended, no abdominal masses Genitourinary: No CVAT. Normal male phallus.  He has a fluctuant area measuring approximately 2 x 4 cm in the right hemiscrotum consistent with abscess.  This is exquisitely tender.  The right testis and left testes are palpably normal. Lymphatic: No lymphadenopathy Neurologic: Grossly intact, no focal deficits Psychiatric: Normal mood and affect  Laboratory Data:  Recent Labs    08/04/23 0809  WBC 9.4  HGB 13.5  HCT 40.4  PLT 317    Recent Labs    08/04/23 0809  NA 138  K 3.6  CL 101  GLUCOSE 103*  BUN 8  CALCIUM 8.7*  CREATININE 1.02     Results for orders placed or performed during the hospital encounter of 08/04/23 (from the past 24 hours)  CBC with Differential     Status: None   Collection Time: 08/04/23  8:09 AM  Result Value Ref Range   WBC 9.4 4.0 - 10.5 K/uL   RBC 4.79 4.22 - 5.81 MIL/uL   Hemoglobin 13.5 13.0 - 17.0 g/dL   HCT 95.2 84.1 - 32.4 %   MCV 84.3 80.0 - 100.0 fL   MCH 28.2 26.0 - 34.0 pg   MCHC 33.4 30.0 - 36.0 g/dL   RDW 40.1 02.7 - 25.3 %   Platelets 317 150 - 400 K/uL   nRBC 0.0 0.0 - 0.2 %   Neutrophils Relative % 67 %   Neutro Abs  6.2 1.7 - 7.7 K/uL   Lymphocytes Relative 25 %   Lymphs Abs 2.3 0.7 - 4.0 K/uL   Monocytes Relative 7 %   Monocytes Absolute 0.7 0.1 - 1.0 K/uL   Eosinophils Relative 1 %   Eosinophils Absolute 0.1 0.0 - 0.5 K/uL   Basophils Relative 0 %   Basophils Absolute 0.0 0.0 - 0.1 K/uL   Immature Granulocytes 0 %   Abs Immature Granulocytes 0.02 0.00 - 0.07 K/uL  Basic metabolic panel     Status: Abnormal   Collection Time: 08/04/23  8:09 AM  Result Value Ref Range   Sodium 138 135 - 145 mmol/L   Potassium 3.6 3.5 - 5.1 mmol/L   Chloride 101 98 - 111 mmol/L   CO2 30 22 - 32 mmol/L   Glucose, Bld 103 (H) 70 - 99 mg/dL   BUN 8 6 - 20 mg/dL    Creatinine, Ser 2.44 0.61 - 1.24 mg/dL   Calcium 8.7 (L) 8.9 - 10.3 mg/dL   GFR, Estimated >01 >02 mL/min   Anion gap 7 5 - 15  Lactic acid, plasma     Status: None   Collection Time: 08/04/23  8:09 AM  Result Value Ref Range   Lactic Acid, Venous 0.5 0.5 - 1.9 mmol/L   No results found for this or any previous visit (from the past 240 hours).  Renal Function: Recent Labs    08/04/23 0809  CREATININE 1.02   Estimated Creatinine Clearance: 137.3 mL/min (by C-G formula based on SCr of 1.02 mg/dL).  Radiologic Imaging: US SCROTUM W/DOPPLER Result Date: 08/04/2023 CLINICAL DATA:  Right scrotal pain and swelling since yesterday. EXAM: SCROTAL ULTRASOUND DOPPLER ULTRASOUND OF THE TESTICLES TECHNIQUE: Complete ultrasound examination of the testicles, epididymis, and other scrotal structures was performed. Color and spectral Doppler ultrasound were also utilized to evaluate blood flow to the testicles. COMPARISON:  Same day CT pelvis at 9 a.m. Scrotal ultrasound dated May 04, 2023. FINDINGS: Right testicle Measurements: 5.3 x 2 x 3.3 cm. There is an irregular, hypoechoic, centrally avascular fluid collection surrounded by a hypervascular rim within the right scrotal wall measuring approximately 5.2 x 3.6 x 2.9 cm, concerning for an abscess. There is surrounding scrotal wall thickening. There is a 2 mm echogenic focus in the right testicle which may represent a small calcification. Right testicular echotexture is otherwise within normal limits. Left testicle Measurements: 5.6 x 2.5 x 3.7 cm. No mass or microlithiasis visualized. Right epididymis:  Normal in size and appearance. Left epididymis:  Normal in size and appearance. Hydrocele:  A small right-sided hydrocele is noted. Varicocele:  None visualized. Pulsed Doppler interrogation of both testes demonstrates normal low resistance arterial and venous waveforms bilaterally. IMPRESSION: 1. Complex fluid collection measuring up to 5.2 cm in the  right scrotal wall is concerning for an abscess. 2. No evidence of testicular torsion. 3. Small right-sided hydrocele. Electronically Signed   By: Hart Robinsons M.D.   On: 08/04/2023 10:57   CT PELVIS W CONTRAST Result Date: 08/04/2023 CLINICAL DATA:  Right scrotal pain and swelling since yesterday. Patient had similar issues 3 months ago which were diagnosed as cellulitis and treated with antibiotics. EXAM: CT PELVIS WITH CONTRAST TECHNIQUE: Multidetector CT imaging of the pelvis was performed using the standard protocol following the bolus administration of intravenous contrast. RADIATION DOSE REDUCTION: This exam was performed according to the departmental dose-optimization program which includes automated exposure control, adjustment of the mA and/or kV according to patient size and/or  use of iterative reconstruction technique. CONTRAST:  OMNIPAQUE IOHEXOL 300 MG/ML  SOLN COMPARISON:  Scrotal ultrasound 05/04/2023. FINDINGS: Urinary Tract: The visualized distal ureters and bladder appear unremarkable. Bowel: No bowel wall thickening, distention or surrounding inflammation identified within the pelvis. The appendix appears normal. Vascular/Lymphatic: Asymmetric prominent right inguinal lymph nodes, measuring up to 1.2 cm on image 37/2 and 1.6 cm on image 40/2. No enlarged pelvic lymph nodes are seen. No significant vascular findings. Reproductive: The prostate gland and seminal vesicles appear unremarkable. Possible complex extratesticular fluid collection posteriorly in the scrotum measuring approximately 3.8 x 4.6 x 2.5 cm, suboptimally evaluated by CT. Small scrotal hydroceles. Other: No pelvic ascites, focal extraluminal fluid collection or pneumoperitoneum. There is mild asymmetric soft tissue stranding within the fat of the right groin. No focal fluid collection foreign body or soft tissue emphysema. No generalized peritoneal inflammatory changes are demonstrated. Musculoskeletal: No acute or  worrisome osseous findings. IMPRESSION: 1. Possible complex extratesticular fluid collection posteriorly in the scrotum, possibly an abscess. This is suboptimally evaluated by CT. Recommend scrotal ultrasound for further evaluation. 2. Mild asymmetric soft tissue stranding within the fat of the right groin, nonspecific but compatible with cellulitis. No focal fluid collection, foreign body or soft tissue emphysema. 3. Asymmetric prominent right inguinal lymph nodes, likely reactive. 4. No acute findings within the pelvis. Electronically Signed   By: Carey Bullocks M.D.   On: 08/04/2023 09:30    I independently reviewed the above imaging studies.  Impression/Recommendation Scrotal abscess: He does appear to have an obvious scrotal abscess that requires drainage.  Based on the size and the tenderness on exam, I think this will be best performed with an incision and drainage in the operating room.  We have reviewed the potential risks, complications, and expected recovery process.  He gives informed consent to proceed.  This will be scheduled.  Crecencio Mc 08/04/2023, 11:51 AM    Moody Bruins MD  CC: Dr. Durwin Nora

## 2023-08-04 NOTE — ED Notes (Signed)
RN informed patient that UA is needed, urinal at bedside clear understanding voiced by patient.

## 2023-08-04 NOTE — Discharge Instructions (Addendum)
Call if fever > 101.  Pain medication and an antibiotic have been called in for you to begin.

## 2023-08-04 NOTE — ED Provider Notes (Signed)
Care of patient assumed from Dr. Florentina Addison following transfer from [facility].  This patient has history of scrotal abscess in September.  He had recurrence of scrotal pain and swelling 2 days ago.  Today, he has received vancomycin, Zosyn, and clindamycin.  He was given fentanyl and Percocet for analgesia.  Lab work shows no leukocytosis.  CT scan shows complex extratesticular fluid collection posterior scrotum, suggestive of abscess.  This was discussed with urologist, Dr. Laverle Patter, who requested ED to ED transfer for bedside evaluation. Physical Exam  BP 119/81   Pulse 84   Temp 98.2 F (36.8 C)   Resp 15   Ht 6\' 1"  (1.854 m)   Wt 117.9 kg   SpO2 96%   BMI 34.30 kg/m   Physical Exam Vitals and nursing note reviewed.  Constitutional:      General: He is not in acute distress.    Appearance: Normal appearance. He is well-developed. He is not ill-appearing, toxic-appearing or diaphoretic.  HENT:     Head: Normocephalic and atraumatic.     Right Ear: External ear normal.     Left Ear: External ear normal.     Nose: Nose normal.     Mouth/Throat:     Mouth: Mucous membranes are moist.  Eyes:     Extraocular Movements: Extraocular movements intact.     Conjunctiva/sclera: Conjunctivae normal.  Cardiovascular:     Rate and Rhythm: Normal rate and regular rhythm.  Pulmonary:     Effort: Pulmonary effort is normal. No respiratory distress.  Abdominal:     General: There is no distension.     Palpations: Abdomen is soft.  Musculoskeletal:        General: No deformity. Normal range of motion.     Cervical back: Normal range of motion and neck supple.  Skin:    General: Skin is warm and dry.     Coloration: Skin is not jaundiced or pale.  Neurological:     General: No focal deficit present.     Mental Status: He is alert and oriented to person, place, and time.  Psychiatric:        Mood and Affect: Mood normal.        Behavior: Behavior normal.     Procedures   Procedures  ED Course / MDM    Medical Decision Making Amount and/or Complexity of Data Reviewed Labs: ordered. Radiology: ordered.  Risk Prescription drug management.   Patient arrives in transfer from [facility] in stable condition.  Fentanyl was ordered for ongoing analgesia.  I spoke with urologist who will come and evaluate.  Patient was taken the OR for incision and drainage.       Gloris Manchester, MD 08/04/23 [phone]

## 2023-08-06 ENCOUNTER — Encounter (HOSPITAL_COMMUNITY): Payer: Self-pay | Admitting: Urology

## 2023-08-06 LAB — AEROBIC/ANAEROBIC CULTURE W GRAM STAIN (SURGICAL/DEEP WOUND)

## 2023-08-06 NOTE — Anesthesia Postprocedure Evaluation (Signed)
Anesthesia Post Note  Patient: Tracy Cisneros  Procedure(s) Performed: IRRIGATION AND DEBRIDEMENT ABSCESS     Patient location during evaluation: PACU Anesthesia Type: General Level of consciousness: awake and alert Pain management: pain level controlled Vital Signs Assessment: post-procedure vital signs reviewed and stable Respiratory status: spontaneous breathing, nonlabored ventilation, respiratory function stable and patient connected to nasal cannula oxygen Cardiovascular status: blood pressure returned to baseline and stable Postop Assessment: no apparent nausea or vomiting Anesthetic complications: no   No notable events documented.  Last Vitals:  Vitals:   08/04/23 1400 08/04/23 1415  BP: (!) 143/92 (!) 133/92  Pulse: 77 76  Resp: 14 20  Temp:  36.8 C  SpO2: 96% 97%    Last Pain:  Vitals:   08/04/23 1415  TempSrc: Oral  PainSc: 0-No pain                 Wes Lezotte S

## 2023-08-09 LAB — CULTURE, BLOOD (ROUTINE X 2)
Culture: NO GROWTH
Culture: NO GROWTH
Special Requests: ADEQUATE
